# Patient Record
Sex: Male | Born: 2000 | Hispanic: No | Marital: Single | Smoking: Never smoker
Health system: Southern US, Community
[De-identification: ages and names within clinical notes are randomized; demographics above are authoritative.]

---
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
## 2020-03-31 ENCOUNTER — Ambulatory Visit: Payer: Self-pay | Attending: Internal Medicine

## 2020-03-31 DIAGNOSIS — Z23 Encounter for immunization: Secondary | ICD-10-CM

## 2020-03-31 NOTE — Progress Notes (Signed)
   Covid-19 Vaccination Clinic  Name:  Warren Bryan    MRN: [id number] DOB: [birth year]  03/31/2020  [name] was observed post Covid-19 immunization for 15 minutes without incident. He was provided with Vaccine Information Sheet and instruction to access the V-Safe system.   [name] was instructed to call 911 with any severe reactions post vaccine: Marland Kitchen Difficulty breathing  . Swelling of face and throat  . A fast heartbeat  . A bad rash all over body  . Dizziness and weakness   Immunizations Administered    Name Date Dose VIS Date Route   Pfizer COVID-19 Vaccine 03/31/2020 10:39 AM 0.3 mL 12/04/2019 Intramuscular   Manufacturer: [employer], [employer]   Lot: [id number]   NDC: [address]-7

## 2020-04-25 ENCOUNTER — Ambulatory Visit: Payer: Self-pay

## 2020-04-27 ENCOUNTER — Ambulatory Visit: Payer: Medicaid Other | Attending: Internal Medicine

## 2020-04-27 ENCOUNTER — Ambulatory Visit: Payer: Self-pay

## 2020-04-27 DIAGNOSIS — Z23 Encounter for immunization: Secondary | ICD-10-CM

## 2020-04-27 NOTE — Progress Notes (Signed)
   Covid-19 Vaccination Clinic  Name:  KIMBERLEY DASTRUP    MRN: [id number] DOB: [birth year]  04/27/2020  Mr. Heiny was observed post Covid-19 immunization for 15 minutes without incident. He was provided with Vaccine Information Sheet and instruction to access the V-Safe system.   Mr. Clinch was instructed to call 911 with any severe reactions post vaccine: Marland Kitchen Difficulty breathing  . Swelling of face and throat  . A fast heartbeat  . A bad rash all over body  . Dizziness and weakness   Immunizations Administered    Name Date Dose VIS Date Route   Pfizer COVID-19 Vaccine 04/27/2020 10:20 AM 0.3 mL 02/17/2019 Intramuscular   Manufacturer: [employer], [employer]   Lot: [id number]   NDC: [address]-2

## 2024-10-29 ENCOUNTER — Inpatient Hospital Stay

## 2024-10-29 ENCOUNTER — Inpatient Hospital Stay: Attending: Hematology and Oncology | Admitting: Hematology and Oncology

## 2024-10-29 ENCOUNTER — Encounter: Payer: Self-pay | Admitting: *Deleted

## 2024-10-29 VITALS — BP 129/82 | HR 79 | Temp 98.3°F | Resp 15 | Wt 146.6 lb

## 2024-10-29 DIAGNOSIS — D751 Secondary polycythemia: Secondary | ICD-10-CM | POA: Insufficient documentation

## 2024-10-29 LAB — CBC WITH DIFFERENTIAL (CANCER CENTER ONLY)
Abs Immature Granulocytes: 0.01 K/uL (ref 0.00–0.07)
Basophils Absolute: 0 K/uL (ref 0.0–0.1)
Basophils Relative: 0 %
Eosinophils Absolute: 0 K/uL (ref 0.0–0.5)
Eosinophils Relative: 1 %
HCT: 50.7 % (ref 39.0–52.0)
Hemoglobin: 18.2 g/dL — ABNORMAL HIGH (ref 13.0–17.0)
Immature Granulocytes: 0 %
Lymphocytes Relative: 21 %
Lymphs Abs: 1.1 K/uL (ref 0.7–4.0)
MCH: 30 pg (ref 26.0–34.0)
MCHC: 35.9 g/dL (ref 30.0–36.0)
MCV: 83.5 fL (ref 80.0–100.0)
Monocytes Absolute: 0.5 K/uL (ref 0.1–1.0)
Monocytes Relative: 9 %
Neutro Abs: 3.6 K/uL (ref 1.7–7.7)
Neutrophils Relative %: 69 %
Platelet Count: 239 K/uL (ref 150–400)
RBC: 6.07 MIL/uL — ABNORMAL HIGH (ref 4.22–5.81)
RDW: 12.9 % (ref 11.5–15.5)
WBC Count: 5.2 K/uL (ref 4.0–10.5)
nRBC: 0 % (ref 0.0–0.2)

## 2024-10-29 LAB — CMP (CANCER CENTER ONLY)
ALT: 21 U/L (ref 0–44)
AST: 18 U/L (ref 15–41)
Albumin: 4.9 g/dL (ref 3.5–5.0)
Alkaline Phosphatase: 66 U/L (ref 38–126)
Anion gap: 6 (ref 5–15)
BUN: 12 mg/dL (ref 6–20)
CO2: 30 mmol/L (ref 22–32)
Calcium: 9.7 mg/dL (ref 8.9–10.3)
Chloride: 106 mmol/L (ref 98–111)
Creatinine: 0.92 mg/dL (ref 0.61–1.24)
GFR, Estimated: >60 mL/min (ref >60)
Glucose, Bld: 86 mg/dL (ref 70–99)
Potassium: 4.2 mmol/L (ref 3.5–5.1)
Sodium: 142 mmol/L (ref 135–145)
Total Bilirubin: 1.2 mg/dL (ref 0.0–1.2)
Total Protein: 7.6 g/dL (ref 6.5–8.1)

## 2024-10-29 LAB — SEDIMENTATION RATE: Sed Rate: 5 mm/h (ref 0–16)

## 2024-10-29 LAB — FERRITIN: Ferritin: 84 ng/mL (ref 24–336)

## 2024-10-29 LAB — C-REACTIVE PROTEIN: CRP: 0.5 mg/dL (ref <1.0)

## 2024-10-29 NOTE — Progress Notes (Signed)
 [facility] [phone]   [phone]  INITIAL CONSULT NOTE  Patient Care Team: Pcp, No as PCP - General  Hematological/Oncological History # Secondary Polycythemia 11/12/2010: White blood cell 13.1, hemoglobin 15.0, MCV 84.2, platelets 239  10/29/2024: establish care with Dr. Federico   CHIEF COMPLAINTS/PURPOSE OF CONSULTATION:  Polycythemia   HISTORY OF PRESENTING ILLNESS:  Warren Bryan 23 y.o. male with no significant past medical history who presents for evaluation of polycythemia.  On review of the previous records Warren Bryan has had elevated hemoglobin dating back to at least 11/12/2010, at which time he had a hemoglobin of 15.0.  White blood cells at time were 13.1.  Due to his chronic polycythemia he was referred to hematology for further evaluation and management.  On exam today Warren Bryan is accompanied by his mother.  He reports that he has not had any medical visits from 2020 until 2025 because his PCP retired.  He just had a new visit with his new PCP and was found to have elevated hemoglobin therefore referred to our clinic.  He reports that he is a non-smoker and he likes to run like Sonic the hedgehog.  He reports he is quite athletic.  He does not use any testosterone supplementation.  Additionally he reports that he has never lived at altitude and he drinks a lot of water.  He notes he does also enjoy drinking alcohol approximately 1 glass/week and he drinks different types of alcohol.  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  On further discussion he reports that he does snore but does not have other symptoms concerning for sleep apnea.  He does not have a neck greater than 9 inches.  He reports that his mother and father are both quite healthy though he did have a maternal great uncle that had cancer.  Otherwise he denies any fevers, chills, sweats, nausea, or diarrhea.  A full  10 point ROS otherwise negative.   SOCIAL HISTORY: Social History   Socioeconomic History   Marital status: Single    Spouse name: Not on file   Number of children: Not on file   Years of education: Not on file   Highest education level: Not on file  Occupational History   Not on file  Tobacco Use   Smoking status: Never   Smokeless tobacco: Not on file  Substance and Sexual Activity   Alcohol use: No   Drug use: Not on file   Sexual activity: Not on file  Other Topics Concern   Not on file  Social History Narrative   Not on file   Social Drivers of Health   Financial Resource Strain: Not on file  Food Insecurity: Food Insecurity Present (10/29/2024)   Hunger Vital Sign    Worried About Running Out of Food in the Last Year: Sometimes true    Ran Out of Food in the Last Year: Sometimes true  Transportation Needs: No Transportation Needs (10/29/2024)   PRAPARE - Administrator, Civil Service (Medical): No    Lack of Transportation (Non-Medical): No  Physical Activity: Not on file  Stress: Not on file  Social Connections: Not on file  Intimate Partner Violence: Not At Risk (10/29/2024)   Humiliation, Afraid, Rape, and Kick questionnaire    Fear of Current or Ex-Partner: No    Emotionally Abused: No    Physically Abused: No  Sexually Abused: No    FAMILY HISTORY: No family history on file.  ALLERGIES:  has no known allergies.  MEDICATIONS:  Current Outpatient Medications  Medication Sig Dispense Refill   methylphenidate (CONCERTA) 18 MG CR tablet Take 18 mg by mouth every morning. Only when in school     No current facility-administered medications for this visit.    REVIEW OF SYSTEMS:   Constitutional: ( - ) fevers, ( - )  chills , ( - ) night sweats Eyes: ( - ) blurriness of vision, ( - ) double vision, ( - ) watery eyes Ears, nose, mouth, throat, and face: ( - ) mucositis, ( - ) sore throat Respiratory: ( - ) cough, ( - ) dyspnea, ( - )  wheezes Cardiovascular: ( - ) palpitation, ( - ) chest discomfort, ( - ) lower extremity swelling Gastrointestinal:  ( - ) nausea, ( - ) heartburn, ( - ) change in bowel habits Skin: ( - ) abnormal skin rashes Lymphatics: ( - ) new lymphadenopathy, ( - ) easy bruising Neurological: ( - ) numbness, ( - ) tingling, ( - ) new weaknesses Behavioral/Psych: ( - ) mood change, ( - ) new changes  All other systems were reviewed with the patient and are negative.  PHYSICAL EXAMINATION:  Vitals:   10/29/24 1318  BP: 129/82  Pulse: 79  Resp: 15  Temp: 98.3 F (36.8 C)  SpO2: 99%   Filed Weights   10/29/24 1318  Weight: 146 lb 9.6 oz (66.5 kg)    GENERAL: well appearing young Hispanic male in NAD  SKIN: skin color, texture, turgor are normal, no rashes or significant lesions EYES: conjunctiva are pink and non-injected, sclera clear LUNGS: clear to auscultation and percussion with normal breathing effort HEART: regular rate & rhythm and no murmurs and no lower extremity edema Musculoskeletal: no cyanosis of digits and no clubbing  PSYCH: alert & oriented x 3, fluent speech NEURO: no focal motor/sensory deficits  LABORATORY DATA:  I have reviewed the data as listed    Latest Ref Rng & Units 10/29/2024    2:08 PM 11/12/2010    8:15 PM  CBC  WBC 4.0 - 10.5 K/uL 5.2  13.1   Hemoglobin 13.0 - 17.0 g/dL 81.7  84.9   Hematocrit 39.0 - 52.0 % 50.7  43.0   Platelets 150 - 400 K/uL 239  239        Latest Ref Rng & Units 10/29/2024    2:08 PM 11/12/2010    8:15 PM  CMP  Glucose 70 - 99 mg/dL 86  893   BUN 6 - 20 mg/dL 12  17   Creatinine 9.38 - 1.24 mg/dL 9.07  9.33   Sodium 864 - 145 mmol/L 142  138   Potassium 3.5 - 5.1 mmol/L 4.2  3.5   Chloride 98 - 111 mmol/L 106  105   CO2 22 - 32 mmol/L 30  23   Calcium 8.9 - 10.3 mg/dL 9.7  9.3   Total Protein 6.5 - 8.1 g/dL 7.6  7.1   Total Bilirubin 0.0 - 1.2 mg/dL 1.2  0.7   Alkaline Phos 38 - 126 U/L 66  198   AST 15 - 41 U/L 18  31    ALT 0 - 44 U/L 21  34      ASSESSMENT & PLAN Warren Bryan 23 y.o. male with no significant past medical history who presents for evaluation of polycythemia.  After review of  the labs, review of the records, and discussion with the patient the patients findings are most consistent with a polycythemia of unclear etiology.   There are two types of polycythemia, Primary polycythemia and secondary polycythemia. Primary polycythemia is overproduction of red blood cells due to a driver mutation. The most common mutation is the JAK2 V617F (95% of cases), but there are other mutations which can cause this disorder. Primary polycythemia is a myeloproliferative neoplasm which may require cytoreductive therapy to decrease risk of thrombosis. This can consist of medications or phlebotomy to drive down the red blood cell counts. Secondary polycythemia is polycythemia driven by low oxygen levels. This represents an appropriate response of the body attempting to increase red cell volume. Causes of secondary polycythemia include smoking (most common), obstructive sleep apnea (OSA), or living at altitude. This can also be caused by testosterone supplementation. Certain thalassemias can present with marked erythrocytosis , but normal hemoglobin. Secondary polycythemia does not have the same level of thrombotic risk and therefore does not require cytoreductive therapy or phlebotomy.    #Polycythemia  --workup to include CBC, CMP, reticulocyte count, and erythropoietin level  --patient is a non-smoker and does not use any testosterone containing products  --patient has symptoms concerning for sleep apnea, will refer to sleep center for polysomnography if additional testing is negative. --will order MPN workup to include JAK2 with reflex and BCR/ABL FISH  --RTC pending the results the above studies.  Orders Placed This Encounter  Procedures   CBC with Differential (Cancer Center Only)    Standing Status:   Future     Number of Occurrences:   1    Expiration Date:   10/29/2025   CMP (Cancer Center only)    Standing Status:   Future    Number of Occurrences:   1    Expiration Date:   10/29/2025   Erythropoietin    Standing Status:   Future    Number of Occurrences:   1    Expiration Date:   10/29/2025   JAK2 V617 reflex CALR/MPL/E12-15    Standing Status:   Future    Number of Occurrences:   1    Expiration Date:   10/29/2025   BCR-ABL1 FISH    Standing Status:   Future    Number of Occurrences:   1    Expiration Date:   10/29/2025   Ferritin    Standing Status:   Future    Number of Occurrences:   1    Expiration Date:   10/29/2025   Sedimentation rate    Standing Status:   Future    Number of Occurrences:   1    Expiration Date:   10/29/2025   C-reactive protein    Standing Status:   Future    Number of Occurrences:   1    Expiration Date:   10/29/2025    All questions were answered. The patient knows to call the clinic with any problems, questions or concerns.  A total of more than 60 minutes were spent on this encounter with face-to-face time and non-face-to-face time, including preparing to see the patient, ordering tests and/or medications, counseling the patient and coordination of care as outlined above.   Norleen IVAR Kidney, MD Department of Hematology/Oncology [facility] Phone: [phone] Pager: [phone] Email: [email or web address]  10/29/2024 4:57 PM

## 2024-10-31 LAB — ERYTHROPOIETIN: Erythropoietin: 7.1 m[IU]/mL (ref 2.6–18.5)

## 2024-11-03 LAB — BCR-ABL1 FISH
Cells Analyzed: 200
Cells Counted: 200

## 2024-11-06 LAB — JAK2 V617F RFX CALR/MPL/E12-15

## 2024-11-06 LAB — CALR +MPL + E12-E15  (REFLEX)

## 2024-12-01 ENCOUNTER — Ambulatory Visit: Payer: Self-pay

## 2024-12-01 NOTE — Telephone Encounter (Signed)
-----   Message from Nurse Almarie T sent at 11/30/2024 11:54 AM EST -----  ----- Message ----- From: Federico Norleen ONEIDA MADISON, MD Sent: 11/30/2024   7:17 AM EST To: Almarie DELENA Arabia, RN  Please let Mr. Perkins know that his bloodwork did not show any concerning abnormalities. There were no mutations or other clear causes for his polycythemia. We would recommend testing for OSA. If he is  agreeable we will put in the referral to sleep medicine.  ----- Message ----- From: Interface, Lab In Big Pool Sent: 10/29/2024   2:21 PM EST To: Norleen ONEIDA Federico MADISON, MD

## 2024-12-01 NOTE — Telephone Encounter (Signed)
 TC to pt regarding recent lab results. Per Dr Federico, MD ,Please let Warren Bryan know that his bloodwork did not show any concerning abnormalities. There were no mutations or other clear causes for his polycythemia. We would recommend testing for OSA. If he is  agreeable we will put in the referral to sleep medicine.     Left message for pt to return call for results as no identifying voice mail.

## 2024-12-02 ENCOUNTER — Telehealth: Payer: Self-pay

## 2024-12-02 NOTE — Telephone Encounter (Signed)
 Returned pt's TC .  Left message to have him call [phone] regarding lab results.
